# Patient Record
Sex: Male | Born: 1965 | Race: Black or African American | Hispanic: No | Marital: Single | Smoking: Former smoker
Health system: Southern US, Community
[De-identification: ages and names within clinical notes are randomized; demographics above are authoritative.]

---

## 1995-11-06 DIAGNOSIS — S31119A Laceration without foreign body of abdominal wall, unspecified quadrant without penetration into peritoneal cavity, initial encounter: Secondary | ICD-10-CM

## 1995-11-06 DIAGNOSIS — S21112A Laceration without foreign body of left front wall of thorax without penetration into thoracic cavity, initial encounter: Secondary | ICD-10-CM

## 1995-11-06 HISTORY — PX: EXPLORATORY LAPAROTOMY: SUR591

## 1995-11-06 HISTORY — DX: Laceration without foreign body of left front wall of thorax without penetration into thoracic cavity, initial encounter: S21.112A

## 1995-11-06 HISTORY — DX: Laceration without foreign body of abdominal wall, unspecified quadrant without penetration into peritoneal cavity, initial encounter: S31.119A

## 2001-01-11 ENCOUNTER — Emergency Department (HOSPITAL_COMMUNITY): Admission: EM | Admit: 2001-01-11 | Discharge: 2001-01-11 | Payer: Self-pay | Admitting: *Deleted

## 2001-01-11 ENCOUNTER — Encounter: Payer: Self-pay | Admitting: Emergency Medicine

## 2001-03-17 ENCOUNTER — Encounter: Payer: Self-pay | Admitting: Neurology

## 2001-03-17 ENCOUNTER — Ambulatory Visit (HOSPITAL_COMMUNITY): Admission: RE | Admit: 2001-03-17 | Discharge: 2001-03-17 | Payer: Self-pay | Admitting: Neurology

## 2005-11-05 DIAGNOSIS — R2681 Unsteadiness on feet: Secondary | ICD-10-CM

## 2005-11-05 HISTORY — DX: Unsteadiness on feet: R26.81

## 2006-07-08 ENCOUNTER — Emergency Department (HOSPITAL_COMMUNITY): Admission: EM | Admit: 2006-07-08 | Discharge: 2006-07-08 | Payer: Self-pay | Admitting: Emergency Medicine

## 2007-05-08 ENCOUNTER — Emergency Department (HOSPITAL_COMMUNITY): Admission: EM | Admit: 2007-05-08 | Discharge: 2007-05-08 | Payer: Self-pay | Admitting: Emergency Medicine

## 2011-11-06 DIAGNOSIS — S43109A Unspecified dislocation of unspecified acromioclavicular joint, initial encounter: Secondary | ICD-10-CM

## 2011-11-06 DIAGNOSIS — I10 Essential (primary) hypertension: Secondary | ICD-10-CM

## 2011-11-06 HISTORY — DX: Essential (primary) hypertension: I10

## 2011-11-06 HISTORY — DX: Unspecified dislocation of unspecified acromioclavicular joint, initial encounter: S43.109A

## 2020-03-25 ENCOUNTER — Emergency Department (HOSPITAL_COMMUNITY)
Admission: EM | Admit: 2020-03-25 | Discharge: 2020-03-25 | Disposition: A | Discharge status: Home / Self Care | Payer: Self-pay | Attending: Emergency Medicine | Admitting: Emergency Medicine

## 2020-03-25 ENCOUNTER — Encounter (HOSPITAL_COMMUNITY): Payer: Self-pay | Admitting: Emergency Medicine

## 2020-03-25 ENCOUNTER — Other Ambulatory Visit: Payer: Self-pay

## 2020-03-25 DIAGNOSIS — R142 Eructation: Secondary | ICD-10-CM | POA: Insufficient documentation

## 2020-03-25 DIAGNOSIS — R109 Unspecified abdominal pain: Secondary | ICD-10-CM

## 2020-03-25 DIAGNOSIS — R1033 Periumbilical pain: Secondary | ICD-10-CM | POA: Insufficient documentation

## 2020-03-25 DIAGNOSIS — R112 Nausea with vomiting, unspecified: Secondary | ICD-10-CM | POA: Insufficient documentation

## 2020-03-25 LAB — CBC
HCT: 43.6 % (ref 39.0–52.0)
Hemoglobin: 14.8 g/dL (ref 13.0–17.0)
MCH: 27.8 pg (ref 26.0–34.0)
MCHC: 33.9 g/dL (ref 30.0–36.0)
MCV: 81.8 fL (ref 80.0–100.0)
Platelets: 442 10*3/uL — ABNORMAL HIGH (ref 150–400)
RBC: 5.33 MIL/uL (ref 4.22–5.81)
RDW: 13.2 % (ref 11.5–15.5)
WBC: 11.3 10*3/uL — ABNORMAL HIGH (ref 4.0–10.5)
nRBC: 0 % (ref 0.0–0.2)

## 2020-03-25 LAB — URINALYSIS, ROUTINE W REFLEX MICROSCOPIC
Bilirubin Urine: NEGATIVE
Glucose, UA: NEGATIVE mg/dL
Ketones, ur: NEGATIVE mg/dL
Leukocytes,Ua: NEGATIVE
Nitrite: NEGATIVE
Protein, ur: 30 mg/dL — AB
Specific Gravity, Urine: 1.016 (ref 1.005–1.030)
pH: 5 (ref 5.0–8.0)

## 2020-03-25 LAB — COMPREHENSIVE METABOLIC PANEL
ALT: 18 U/L (ref 0–44)
AST: 17 U/L (ref 15–41)
Albumin: 3.9 g/dL (ref 3.5–5.0)
Alkaline Phosphatase: 60 U/L (ref 38–126)
Anion gap: 14 (ref 5–15)
BUN: 49 mg/dL — ABNORMAL HIGH (ref 6–20)
CO2: 21 mmol/L — ABNORMAL LOW (ref 22–32)
Calcium: 8.2 mg/dL — ABNORMAL LOW (ref 8.9–10.3)
Chloride: 95 mmol/L — ABNORMAL LOW (ref 98–111)
Creatinine, Ser: 1.78 mg/dL — ABNORMAL HIGH (ref 0.61–1.24)
GFR calc Af Amer: 50 mL/min — ABNORMAL LOW (ref >60)
GFR calc non Af Amer: 43 mL/min — ABNORMAL LOW (ref >60)
Glucose, Bld: 185 mg/dL — ABNORMAL HIGH (ref 70–99)
Potassium: 2.6 mmol/L — CL (ref 3.5–5.1)
Sodium: 130 mmol/L — ABNORMAL LOW (ref 135–145)
Total Bilirubin: 0.4 mg/dL (ref 0.3–1.2)
Total Protein: 8.2 g/dL — ABNORMAL HIGH (ref 6.5–8.1)

## 2020-03-25 LAB — MAGNESIUM: Magnesium: 2.3 mg/dL (ref 1.7–2.4)

## 2020-03-25 LAB — LIPASE, BLOOD: Lipase: 131 U/L — ABNORMAL HIGH (ref 11–51)

## 2020-03-25 MED ORDER — ONDANSETRON 4 MG PO TBDP
4.0000 mg | ORAL_TABLET | Freq: Three times a day (TID) | ORAL | 0 refills | Status: DC | PRN
Start: 2020-03-25 — End: 2020-04-02

## 2020-03-25 MED ORDER — SODIUM CHLORIDE 0.9 % IV BOLUS
1000.0000 mL | Freq: Once | INTRAVENOUS | Status: AC
  Administered 2020-03-25: 1000 mL via INTRAVENOUS

## 2020-03-25 MED ORDER — FAMOTIDINE IN NACL 20-0.9 MG/50ML-% IV SOLN
20.0000 mg | INTRAVENOUS | Status: AC
  Administered 2020-03-25: 20 mg via INTRAVENOUS
  Filled 2020-03-25: qty 50

## 2020-03-25 MED ORDER — POTASSIUM CHLORIDE CRYS ER 20 MEQ PO TBCR
40.0000 meq | EXTENDED_RELEASE_TABLET | Freq: Once | ORAL | Status: AC
  Administered 2020-03-25: 40 meq via ORAL
  Filled 2020-03-25: qty 2

## 2020-03-25 MED ORDER — OXYCODONE-ACETAMINOPHEN 5-325 MG PO TABS
1.0000 | ORAL_TABLET | Freq: Once | ORAL | Status: AC
  Administered 2020-03-25: 1 via ORAL
  Filled 2020-03-25: qty 1

## 2020-03-25 MED ORDER — SUCRALFATE 1 G PO TABS
1.0000 g | ORAL_TABLET | Freq: Once | ORAL | Status: AC
  Administered 2020-03-25: 1 g via ORAL
  Filled 2020-03-25: qty 1

## 2020-03-25 MED ORDER — FAMOTIDINE 20 MG PO TABS: 20.0000 mg | ORAL_TABLET | Freq: Every day | ORAL | 0 refills | Status: AC

## 2020-03-25 NOTE — ED Notes (Addendum)
Date and time results received: 03/25/20 3:44 AM  (use smartphrase ".now" to insert current time)  Test: potassium Critical Value: 2.6  Name of Provider Notified: Sharilyn Sites, PA  Orders Received? Or Actions Taken?: potassium ordered

## 2020-03-25 NOTE — ED Provider Notes (Signed)
[facility] DEPT Provider Note   CSN: [id number] Arrival date & time: 03/25/20  0244     History Chief Complaint  Patient presents with  . Abdominal Pain    Frank Rivers is a 54 y.o. male.  The history is provided by the patient and medical records.  Abdominal Pain   54 year old male presenting to the ED with mid abdominal pain for the past 3 days.  States he works Monday and Tuesday and felt fine, woke up Wednesday morning with nausea, vomiting, and diarrhea.  He has not had any noted fevers or chills.  States he has had a lot of upper and mid abdominal pain, worse around his navel.  He does report history of stomach ulcers and initially pain felt similar, now having some pain in his back as well.  He does admit to being bent over the toilet and vomiting the past few days. He denies any alcohol intake.  No history of gallbladder disease.  He does report a lot of belching and sensation of acid in his throat.  His sister gave him an acid pill prior to arrival which did help some.  History reviewed. No pertinent past medical history.  There are no problems to display for this patient.   History reviewed. No pertinent surgical history.     History reviewed. No pertinent family history.  Social History   Tobacco Use  . Smoking status: Former Research scientist (life sciences)  . Smokeless tobacco: Never Used  Substance Use Topics  . Alcohol use: Yes    Alcohol/week: 12.0 standard drinks    Types: 12 Cans of beer per week  . Drug use: Yes    Types: Marijuana    Home Medications Prior to Admission medications   Not on File    Allergies    Patient has no allergy information on record.  Review of Systems   Review of Systems  Gastrointestinal: Positive for abdominal pain.  All other systems reviewed and are negative.   Physical Exam Updated Vital Signs BP (!) 154/114   Pulse 93   Temp 97.6 F (36.4 C) (Oral)   Resp 19   Ht 5' 7"  (1.702 m)   Wt 74.8 kg    SpO2 100%   BMI 25.84 kg/m   Physical Exam Vitals and nursing note reviewed.  Constitutional:      Appearance: He is well-developed.     Comments: Belching frequently during exam  HENT:     Head: Normocephalic and atraumatic.     Mouth/Throat:     Comments: Mucous membranes are dry Eyes:     Conjunctiva/sclera: Conjunctivae normal.     Pupils: Pupils are equal, round, and reactive to light.  Cardiovascular:     Rate and Rhythm: Normal rate and regular rhythm.     Heart sounds: Normal heart sounds.  Pulmonary:     Effort: Pulmonary effort is normal.     Breath sounds: Normal breath sounds.  Abdominal:     General: Bowel sounds are normal.     Palpations: Abdomen is soft.     Tenderness: There is no abdominal tenderness. There is no guarding or rebound.     Comments: Well-healed midline surgical incision, there is no focal tenderness or peritoneal signs, bowel sounds are normal, no distention  Musculoskeletal:        General: Normal range of motion.     Cervical back: Normal range of motion.  Skin:    General: Skin is warm and dry.  Neurological:     Mental Status: He is alert and oriented to person, place, and time.     ED Results / Procedures / Treatments   Labs (all labs ordered are listed, but only abnormal results are displayed) Labs Reviewed  LIPASE, BLOOD - Abnormal; Notable for the following components:      Result Value   Lipase 131 (*)    All other components within normal limits  COMPREHENSIVE METABOLIC PANEL - Abnormal; Notable for the following components:   Sodium 130 (*)    Potassium 2.6 (*)    Chloride 95 (*)    CO2 21 (*)    Glucose, Bld 185 (*)    BUN 49 (*)    Creatinine, Ser 1.78 (*)    Calcium 8.2 (*)    Total Protein 8.2 (*)    GFR calc non Af Amer 43 (*)    GFR calc Af Amer 50 (*)    All other components within normal limits  CBC - Abnormal; Notable for the following components:   WBC 11.3 (*)    Platelets 442 (*)    All other  components within normal limits  MAGNESIUM  URINALYSIS, ROUTINE W REFLEX MICROSCOPIC    EKG EKG Interpretation  Date/Time:  Friday Mar 25 2020 03:10:20 EDT Ventricular Rate:  84 PR Interval:    QRS Duration: 88 QT Interval:  342 QTC Calculation: 405 R Axis:   -63 Text Interpretation: Sinus rhythm Biatrial enlargement Left anterior fascicular block Abnormal R-wave progression, early transition Left ventricular hypertrophy No STEMI. No old tracing to compare Confirmed by Addison Lank [phone]) on 03/25/2020 3:13:33 AM   Radiology No results found.  Procedures Procedures (including critical care time)  Medications Ordered in ED Medications  sodium chloride 0.9 % bolus 1,000 mL (0 mLs Intravenous Stopped 03/25/20 0545)  sucralfate (CARAFATE) tablet 1 g (1 g Oral Given 03/25/20 0334)  famotidine (PEPCID) IVPB 20 mg premix (0 mg Intravenous Stopped 03/25/20 0545)  potassium chloride SA (KLOR-CON) CR tablet 40 mEq (40 mEq Oral Given 03/25/20 0353)  oxyCODONE-acetaminophen (PERCOCET/ROXICET) 5-325 MG per tablet 1 tablet (1 tablet Oral Given 03/25/20 0547)    ED Course  I have reviewed the triage vital signs and the nursing notes.  Pertinent labs & imaging results that were available during my care of the patient were reviewed by me and considered in my medical decision making (see chart for details).    MDM Rules/Calculators/A&P  54 year old male here with mid abdominal pain, nausea, vomiting, and diarrhea over the past 3 days.  He is afebrile and nontoxic in appearance here.  His abdomen is soft and benign.  He does appear clinically dry.  Patient does report history of stomach ulcers and states initially symptoms felt similar.  He has had a lot of belching this evening.  His sister gave him an acid reflux pill which seemed to help a little.  Screening labs are pending.  He was given IV fluids, Pepcid, Carafate, and Zofran.  Will reassess.  5:25 AM Labs with findings of likely  dehydration with SrCr of 1.78 and BUN 49.  He does have mild elevation of lipase but normal LFTs, bili, and alk phos.  Potassium was low here at 2.6, given PO replacement.  Likely from vomiting.  No prior labs available for comparison.  Patient reports feeling much better at this time. States his back is still aching, however it is the lower back.  Thinks it is where he has been bent over  vomiting over the past few days. Will give PO percocet. He has tolerated 3 cups of water here without any active emesis or nausea.  Vital signs are stable.  Feel he is stable for discharge home.  Plan to continue symptomatic management, recommended bland diet progression back to normal as tolerated.  Encouraged to push oral fluids as much as possible.  Does not appear to have a PCP at this time so will be referred to wellness clinic for follow-up.  Final Clinical Impression(s) / ED Diagnoses Final diagnoses:  Abdominal pain, unspecified abdominal location  Nausea vomiting and diarrhea    Rx / DC Orders ED Discharge Orders         Ordered    ondansetron (ZOFRAN ODT) 4 MG disintegrating tablet  Every 8 hours PRN     03/25/20 0553    famotidine (PEPCID) 20 MG tablet  Daily     03/25/20 0553           Larene Pickett, PA-C 03/25/20 0557    Fatima Blank, MD 03/25/20 [phone]

## 2020-03-25 NOTE — Discharge Instructions (Signed)
Take the prescribed medication as directed.  Recommend that you follow a bland diet for now and progress back to normal as tolerated.  Try to increase water intake so you can stay hydrated. Follow-up with your primary care doctor.  If you do not have one, you may try to follow-up with the wellness clinic. Return to the ED for new or worsening symptoms.

## 2020-03-25 NOTE — ED Triage Notes (Signed)
Pt arrived via EMS. Pt has been having epigastric pain that moved into the center of his back. Pt reports N/V/D, which started Wednesday morning. Pt reports that he has not been able to eat or drink since Wednesday. Pt has hx of ulcers which have had similar burning feelings, but has never had the pain go into his back. Pt reports no hx of diabetes or hypertension

## 2020-03-26 ENCOUNTER — Other Ambulatory Visit: Payer: Self-pay

## 2020-03-26 ENCOUNTER — Encounter (HOSPITAL_COMMUNITY): Payer: Self-pay

## 2020-03-26 DIAGNOSIS — F10231 Alcohol dependence with withdrawal delirium: Secondary | ICD-10-CM | POA: Diagnosis present

## 2020-03-26 DIAGNOSIS — R079 Chest pain, unspecified: Secondary | ICD-10-CM | POA: Diagnosis present

## 2020-03-26 DIAGNOSIS — E871 Hypo-osmolality and hyponatremia: Secondary | ICD-10-CM | POA: Diagnosis present

## 2020-03-26 DIAGNOSIS — E8809 Other disorders of plasma-protein metabolism, not elsewhere classified: Secondary | ICD-10-CM | POA: Diagnosis present

## 2020-03-26 DIAGNOSIS — M549 Dorsalgia, unspecified: Secondary | ICD-10-CM | POA: Diagnosis present

## 2020-03-26 DIAGNOSIS — Z87891 Personal history of nicotine dependence: Secondary | ICD-10-CM

## 2020-03-26 DIAGNOSIS — I10 Essential (primary) hypertension: Secondary | ICD-10-CM | POA: Diagnosis present

## 2020-03-26 DIAGNOSIS — Z8711 Personal history of peptic ulcer disease: Secondary | ICD-10-CM

## 2020-03-26 DIAGNOSIS — R739 Hyperglycemia, unspecified: Secondary | ICD-10-CM | POA: Diagnosis present

## 2020-03-26 DIAGNOSIS — K219 Gastro-esophageal reflux disease without esophagitis: Secondary | ICD-10-CM | POA: Diagnosis present

## 2020-03-26 DIAGNOSIS — Z20822 Contact with and (suspected) exposure to covid-19: Secondary | ICD-10-CM | POA: Diagnosis present

## 2020-03-26 DIAGNOSIS — E876 Hypokalemia: Secondary | ICD-10-CM | POA: Diagnosis present

## 2020-03-26 DIAGNOSIS — N179 Acute kidney failure, unspecified: Secondary | ICD-10-CM | POA: Diagnosis present

## 2020-03-26 DIAGNOSIS — Z79899 Other long term (current) drug therapy: Secondary | ICD-10-CM

## 2020-03-26 DIAGNOSIS — E861 Hypovolemia: Secondary | ICD-10-CM | POA: Diagnosis present

## 2020-03-26 DIAGNOSIS — K56609 Unspecified intestinal obstruction, unspecified as to partial versus complete obstruction: Principal | ICD-10-CM | POA: Diagnosis present

## 2020-03-26 LAB — CBC
HCT: 46 % (ref 39.0–52.0)
Hemoglobin: 15.5 g/dL (ref 13.0–17.0)
MCH: 27.6 pg (ref 26.0–34.0)
MCHC: 33.7 g/dL (ref 30.0–36.0)
MCV: 82 fL (ref 80.0–100.0)
Platelets: 549 10*3/uL — ABNORMAL HIGH (ref 150–400)
RBC: 5.61 MIL/uL (ref 4.22–5.81)
RDW: 13 % (ref 11.5–15.5)
WBC: 10.3 10*3/uL (ref 4.0–10.5)
nRBC: 0 % (ref 0.0–0.2)

## 2020-03-26 MED ORDER — SODIUM CHLORIDE 0.9% FLUSH: 3.0000 mL | Freq: Once | INTRAVENOUS | Status: AC

## 2020-03-26 NOTE — ED Triage Notes (Addendum)
Pt BIB [facility] for back pain, epigastric, and anterior chest wall pain. Given of fentanyl en route. Seen recently for same and states that symptoms have not improved. Hx ulcers.

## 2020-03-27 ENCOUNTER — Encounter (HOSPITAL_COMMUNITY): Payer: Self-pay

## 2020-03-27 ENCOUNTER — Emergency Department (HOSPITAL_COMMUNITY): Payer: Self-pay

## 2020-03-27 ENCOUNTER — Inpatient Hospital Stay (HOSPITAL_COMMUNITY): Payer: Self-pay

## 2020-03-27 ENCOUNTER — Other Ambulatory Visit: Payer: Self-pay

## 2020-03-27 ENCOUNTER — Inpatient Hospital Stay (HOSPITAL_COMMUNITY)
Admission: EM | Admit: 2020-03-27 | Discharge: 2020-04-02 | DRG: 389 | Disposition: A | Discharge status: Home / Self Care | Payer: Self-pay | Attending: Internal Medicine | Admitting: Internal Medicine

## 2020-03-27 DIAGNOSIS — E871 Hypo-osmolality and hyponatremia: Secondary | ICD-10-CM

## 2020-03-27 DIAGNOSIS — Z8711 Personal history of peptic ulcer disease: Secondary | ICD-10-CM

## 2020-03-27 DIAGNOSIS — Z0189 Encounter for other specified special examinations: Secondary | ICD-10-CM

## 2020-03-27 DIAGNOSIS — K5900 Constipation, unspecified: Secondary | ICD-10-CM

## 2020-03-27 DIAGNOSIS — N179 Acute kidney failure, unspecified: Secondary | ICD-10-CM

## 2020-03-27 DIAGNOSIS — E860 Dehydration: Secondary | ICD-10-CM

## 2020-03-27 DIAGNOSIS — R1013 Epigastric pain: Secondary | ICD-10-CM

## 2020-03-27 DIAGNOSIS — E876 Hypokalemia: Secondary | ICD-10-CM

## 2020-03-27 DIAGNOSIS — R112 Nausea with vomiting, unspecified: Secondary | ICD-10-CM

## 2020-03-27 DIAGNOSIS — I10 Essential (primary) hypertension: Secondary | ICD-10-CM

## 2020-03-27 DIAGNOSIS — R079 Chest pain, unspecified: Secondary | ICD-10-CM

## 2020-03-27 DIAGNOSIS — R739 Hyperglycemia, unspecified: Secondary | ICD-10-CM

## 2020-03-27 DIAGNOSIS — E8809 Other disorders of plasma-protein metabolism, not elsewhere classified: Secondary | ICD-10-CM

## 2020-03-27 DIAGNOSIS — K56609 Unspecified intestinal obstruction, unspecified as to partial versus complete obstruction: Principal | ICD-10-CM

## 2020-03-27 DIAGNOSIS — D72829 Elevated white blood cell count, unspecified: Secondary | ICD-10-CM | POA: Diagnosis present

## 2020-03-27 DIAGNOSIS — F1290 Cannabis use, unspecified, uncomplicated: Secondary | ICD-10-CM

## 2020-03-27 DIAGNOSIS — F1010 Alcohol abuse, uncomplicated: Secondary | ICD-10-CM

## 2020-03-27 DIAGNOSIS — Z4659 Encounter for fitting and adjustment of other gastrointestinal appliance and device: Secondary | ICD-10-CM

## 2020-03-27 HISTORY — DX: Personal history of peptic ulcer disease: Z87.11

## 2020-03-27 LAB — URINALYSIS, ROUTINE W REFLEX MICROSCOPIC
Bacteria, UA: NONE SEEN
Bilirubin Urine: NEGATIVE
Glucose, UA: NEGATIVE mg/dL
Ketones, ur: 5 mg/dL — AB
Leukocytes,Ua: NEGATIVE
Nitrite: NEGATIVE
Protein, ur: NEGATIVE mg/dL
Specific Gravity, Urine: >1.046 — ABNORMAL HIGH (ref 1.005–1.030)
pH: 6 (ref 5.0–8.0)

## 2020-03-27 LAB — BASIC METABOLIC PANEL
Anion gap: 11 (ref 5–15)
BUN: 29 mg/dL — ABNORMAL HIGH (ref 6–20)
CO2: 27 mmol/L (ref 22–32)
Calcium: 9 mg/dL (ref 8.9–10.3)
Chloride: 95 mmol/L — ABNORMAL LOW (ref 98–111)
Creatinine, Ser: 1.29 mg/dL — ABNORMAL HIGH (ref 0.61–1.24)
GFR calc Af Amer: >60 mL/min (ref >60)
GFR calc non Af Amer: >60 mL/min (ref >60)
Glucose, Bld: 102 mg/dL — ABNORMAL HIGH (ref 70–99)
Potassium: 3.5 mmol/L (ref 3.5–5.1)
Sodium: 133 mmol/L — ABNORMAL LOW (ref 135–145)

## 2020-03-27 LAB — CBC
HCT: 40.2 % (ref 39.0–52.0)
Hemoglobin: 13.3 g/dL (ref 13.0–17.0)
MCH: 27.5 pg (ref 26.0–34.0)
MCHC: 33.1 g/dL (ref 30.0–36.0)
MCV: 83.2 fL (ref 80.0–100.0)
Platelets: 482 10*3/uL — ABNORMAL HIGH (ref 150–400)
RBC: 4.83 MIL/uL (ref 4.22–5.81)
RDW: 13.2 % (ref 11.5–15.5)
WBC: 12 10*3/uL — ABNORMAL HIGH (ref 4.0–10.5)
nRBC: 0 % (ref 0.0–0.2)

## 2020-03-27 LAB — COMPREHENSIVE METABOLIC PANEL
ALT: 20 U/L (ref 0–44)
AST: 19 U/L (ref 15–41)
Albumin: 4 g/dL (ref 3.5–5.0)
Alkaline Phosphatase: 68 U/L (ref 38–126)
Anion gap: 9 (ref 5–15)
BUN: 32 mg/dL — ABNORMAL HIGH (ref 6–20)
CO2: 27 mmol/L (ref 22–32)
Calcium: 9.4 mg/dL (ref 8.9–10.3)
Chloride: 92 mmol/L — ABNORMAL LOW (ref 98–111)
Creatinine, Ser: 1.35 mg/dL — ABNORMAL HIGH (ref 0.61–1.24)
GFR calc Af Amer: >60 mL/min (ref >60)
GFR calc non Af Amer: 60 mL/min — ABNORMAL LOW (ref >60)
Glucose, Bld: 159 mg/dL — ABNORMAL HIGH (ref 70–99)
Potassium: 3.8 mmol/L (ref 3.5–5.1)
Sodium: 128 mmol/L — ABNORMAL LOW (ref 135–145)
Total Bilirubin: 1.2 mg/dL (ref 0.3–1.2)
Total Protein: 8.8 g/dL — ABNORMAL HIGH (ref 6.5–8.1)

## 2020-03-27 LAB — HIV ANTIBODY (ROUTINE TESTING W REFLEX): HIV Screen 4th Generation wRfx: NONREACTIVE

## 2020-03-27 LAB — SARS CORONAVIRUS 2 BY RT PCR (HOSPITAL ORDER, PERFORMED IN ~~LOC~~ HOSPITAL LAB): SARS Coronavirus 2: NEGATIVE

## 2020-03-27 LAB — HEMOGLOBIN A1C
Hgb A1c MFr Bld: 6.2 % — ABNORMAL HIGH (ref 4.8–5.6)
Mean Plasma Glucose: 131.24 mg/dL

## 2020-03-27 LAB — MAGNESIUM: Magnesium: 2.2 mg/dL (ref 1.7–2.4)

## 2020-03-27 LAB — PREALBUMIN: Prealbumin: 26.8 mg/dL (ref 18–38)

## 2020-03-27 LAB — PHOSPHORUS: Phosphorus: 3.4 mg/dL (ref 2.5–4.6)

## 2020-03-27 LAB — LIPASE, BLOOD: Lipase: 147 U/L — ABNORMAL HIGH (ref 11–51)

## 2020-03-27 MED ORDER — MAGIC MOUTHWASH
15.0000 mL | Freq: Four times a day (QID) | ORAL | Status: DC | PRN
  Filled 2020-03-27: qty 15

## 2020-03-27 MED ORDER — HEPARIN SODIUM (PORCINE) 5000 UNIT/ML IJ SOLN
5000.0000 [IU] | Freq: Three times a day (TID) | INTRAMUSCULAR | Status: DC
  Administered 2020-03-27 – 2020-04-02 (×17): 5000 [IU] via SUBCUTANEOUS
  Filled 2020-03-27 (×17): qty 1

## 2020-03-27 MED ORDER — FAMOTIDINE IN NACL 20-0.9 MG/50ML-% IV SOLN
20.0000 mg | Freq: Two times a day (BID) | INTRAVENOUS | Status: DC
  Administered 2020-03-27 – 2020-04-01 (×12): 20 mg via INTRAVENOUS
  Filled 2020-03-27 (×12): qty 50

## 2020-03-27 MED ORDER — PROCHLORPERAZINE EDISYLATE 10 MG/2ML IJ SOLN
5.0000 mg | INTRAMUSCULAR | Status: DC | PRN
  Administered 2020-03-27: 10 mg via INTRAVENOUS
  Filled 2020-03-27: qty 2

## 2020-03-27 MED ORDER — DIATRIZOATE MEGLUMINE & SODIUM 66-10 % PO SOLN
90.0000 mL | Freq: Once | ORAL | Status: AC
  Administered 2020-03-27: 90 mL via NASOGASTRIC
  Filled 2020-03-27: qty 90

## 2020-03-27 MED ORDER — SODIUM CHLORIDE (PF) 0.9 % IJ SOLN
INTRAMUSCULAR | Status: AC
  Administered 2020-03-27: 3 mL via INTRAVENOUS
  Filled 2020-03-27: qty 50

## 2020-03-27 MED ORDER — LACTATED RINGERS IV BOLUS
1000.0000 mL | Freq: Once | INTRAVENOUS | Status: AC
  Administered 2020-03-27: 1000 mL via INTRAVENOUS

## 2020-03-27 MED ORDER — LORAZEPAM 1 MG PO TABS
0.0000 mg | ORAL_TABLET | Freq: Four times a day (QID) | ORAL | Status: AC
  Filled 2020-03-27: qty 1
  Filled 2020-03-27: qty 2

## 2020-03-27 MED ORDER — ALUM & MAG HYDROXIDE-SIMETH 200-200-20 MG/5ML PO SUSP: 30.0000 mL | Freq: Four times a day (QID) | ORAL | Status: DC | PRN

## 2020-03-27 MED ORDER — DIPHENHYDRAMINE HCL 50 MG/ML IJ SOLN: 12.5000 mg | Freq: Four times a day (QID) | INTRAMUSCULAR | Status: DC | PRN

## 2020-03-27 MED ORDER — IOHEXOL 300 MG/ML  SOLN
100.0000 mL | Freq: Once | INTRAMUSCULAR | Status: AC | PRN
  Administered 2020-03-27: 100 mL via INTRAVENOUS

## 2020-03-27 MED ORDER — PANTOPRAZOLE SODIUM 40 MG IV SOLR
40.0000 mg | Freq: Every day | INTRAVENOUS | Status: DC
  Administered 2020-03-28 – 2020-04-01 (×5): 40 mg via INTRAVENOUS
  Filled 2020-03-27 (×7): qty 40

## 2020-03-27 MED ORDER — MENTHOL 3 MG MT LOZG
1.0000 | LOZENGE | OROMUCOSAL | Status: DC | PRN
  Filled 2020-03-27: qty 9

## 2020-03-27 MED ORDER — PHENOL 1.4 % MT LIQD
2.0000 | OROMUCOSAL | Status: DC | PRN
  Filled 2020-03-27: qty 177

## 2020-03-27 MED ORDER — MORPHINE SULFATE (PF) 4 MG/ML IV SOLN
4.0000 mg | Freq: Once | INTRAVENOUS | Status: AC
  Administered 2020-03-27: 4 mg via INTRAVENOUS
  Filled 2020-03-27: qty 1

## 2020-03-27 MED ORDER — SODIUM CHLORIDE 0.9 % IV BOLUS
1000.0000 mL | Freq: Once | INTRAVENOUS | Status: AC
  Administered 2020-03-27: 1000 mL via INTRAVENOUS

## 2020-03-27 MED ORDER — MORPHINE SULFATE (PF) 2 MG/ML IV SOLN
2.0000 mg | INTRAVENOUS | Status: DC | PRN
Start: 2020-03-27 — End: 2020-03-27

## 2020-03-27 MED ORDER — SODIUM CHLORIDE 0.9 % IV SOLN
8.0000 mg | Freq: Four times a day (QID) | INTRAVENOUS | Status: DC | PRN
  Filled 2020-03-27: qty 4

## 2020-03-27 MED ORDER — LACTATED RINGERS IV SOLN: INTRAVENOUS | Status: AC

## 2020-03-27 MED ORDER — ENALAPRILAT 1.25 MG/ML IV SOLN
0.6250 mg | Freq: Four times a day (QID) | INTRAVENOUS | Status: DC | PRN
  Filled 2020-03-27: qty 1

## 2020-03-27 MED ORDER — ONDANSETRON HCL 4 MG/2ML IJ SOLN
4.0000 mg | Freq: Once | INTRAMUSCULAR | Status: AC
  Administered 2020-03-27: 4 mg via INTRAVENOUS
  Filled 2020-03-27: qty 2

## 2020-03-27 MED ORDER — METOPROLOL TARTRATE 5 MG/5ML IV SOLN
5.0000 mg | Freq: Four times a day (QID) | INTRAVENOUS | Status: DC
  Administered 2020-03-27 – 2020-04-02 (×25): 5 mg via INTRAVENOUS
  Filled 2020-03-27 (×25): qty 5

## 2020-03-27 MED ORDER — LORAZEPAM 1 MG PO TABS: 0.0000 mg | ORAL_TABLET | Freq: Two times a day (BID) | ORAL | Status: AC

## 2020-03-27 MED ORDER — LORAZEPAM 1 MG PO TABS
1.0000 mg | ORAL_TABLET | ORAL | Status: AC | PRN
  Administered 2020-03-29: 2 mg via ORAL

## 2020-03-27 MED ORDER — HYDROMORPHONE HCL 1 MG/ML IJ SOLN
0.5000 mg | INTRAMUSCULAR | Status: DC | PRN
  Administered 2020-03-27 (×3): 2 mg via INTRAVENOUS
  Administered 2020-03-28 (×2): 1 mg via INTRAVENOUS
  Filled 2020-03-27 (×2): qty 2
  Filled 2020-03-27: qty 1
  Filled 2020-03-27: qty 2
  Filled 2020-03-27: qty 1

## 2020-03-27 MED ORDER — LIP MEDEX EX OINT
1.0000 "application " | TOPICAL_OINTMENT | Freq: Two times a day (BID) | CUTANEOUS | Status: DC
  Administered 2020-03-27 – 2020-04-02 (×12): 1 via TOPICAL
  Filled 2020-03-27 (×4): qty 7

## 2020-03-27 MED ORDER — METHOCARBAMOL 1000 MG/10ML IJ SOLN
1000.0000 mg | Freq: Four times a day (QID) | INTRAVENOUS | Status: DC | PRN
  Filled 2020-03-27: qty 10

## 2020-03-27 MED ORDER — LORAZEPAM 2 MG/ML IJ SOLN
1.0000 mg | INTRAMUSCULAR | Status: AC | PRN
  Administered 2020-03-28 (×2): 2 mg via INTRAVENOUS
  Filled 2020-03-27 (×3): qty 1

## 2020-03-27 MED ORDER — ONDANSETRON HCL 4 MG/2ML IJ SOLN
4.0000 mg | Freq: Four times a day (QID) | INTRAMUSCULAR | Status: DC | PRN
  Administered 2020-03-27 – 2020-03-31 (×4): 4 mg via INTRAVENOUS
  Filled 2020-03-27 (×4): qty 2

## 2020-03-27 MED ORDER — SIMETHICONE 40 MG/0.6ML PO SUSP
40.0000 mg | Freq: Four times a day (QID) | ORAL | Status: DC | PRN
  Filled 2020-03-27: qty 0.6

## 2020-03-27 MED ORDER — LACTATED RINGERS IV BOLUS: 1000.0000 mL | Freq: Three times a day (TID) | INTRAVENOUS | Status: AC | PRN

## 2020-03-27 MED ORDER — ACETAMINOPHEN 650 MG RE SUPP: 650.0000 mg | Freq: Four times a day (QID) | RECTAL | Status: DC | PRN

## 2020-03-27 MED ORDER — BISACODYL 10 MG RE SUPP: 10.0000 mg | Freq: Two times a day (BID) | RECTAL | Status: DC | PRN
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
## 2020-03-27 NOTE — Progress Notes (Signed)
Patient on floor.  Less abdominal pain since nasogastric tube placed.  Surgery will continue to follow.  Continue small bowel protocol.
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
## 2020-03-27 NOTE — ED Provider Notes (Signed)
[facility] DEPT Provider Note   CSN: [id number] Arrival date & time: 03/26/20  2324     History Chief Complaint  Patient presents with  . Abdominal Pain  . Back Pain    Frank Rivers is a 54 y.o. male.  Patient presents to the emergency department with a chief complaint of abdominal pain.  He reports having epigastric abdominal pain for the past several days.  He reports associated nausea and vomiting.  He states that the pain radiates into his back.  He states that he does drink alcohol regularly, but has not been able to drink because of vomiting.  He denies any fevers or chills.  Denies any history of pancreatitis.  Denies any known medical problems.  He states that he does not take any medications on a daily basis.  His symptoms are worsened with eating and with palpation.  The history is provided by the patient. No language interpreter was used.       History reviewed. No pertinent past medical history.  There are no problems to display for this patient.   History reviewed. No pertinent surgical history.     History reviewed. No pertinent family history.  Social History   Tobacco Use  . Smoking status: Former Research scientist (life sciences)  . Smokeless tobacco: Never Used  Substance Use Topics  . Alcohol use: Yes    Alcohol/week: 12.0 standard drinks    Types: 12 Cans of beer per week  . Drug use: Yes    Types: Marijuana    Home Medications Prior to Admission medications   Medication Sig Start Date End Date Taking? Authorizing Provider  famotidine (PEPCID) 20 MG tablet Take 1 tablet (20 mg total) by mouth daily. 03/25/20  Yes Larene Pickett, PA-C  ondansetron (ZOFRAN ODT) 4 MG disintegrating tablet Take 1 tablet (4 mg total) by mouth every 8 (eight) hours as needed for nausea. 03/25/20  Yes Larene Pickett, PA-C    Allergies    Patient has no known allergies.  Review of Systems   Review of Systems  All other systems reviewed and are  negative.   Physical Exam Updated Vital Signs BP (!) 149/106   Pulse 96   Temp 98.5 F (36.9 C) (Oral)   Resp 18   Ht 5\' 7"  (1.702 m)   Wt 74.8 kg   SpO2 98%   BMI 25.84 kg/m   Physical Exam Vitals and nursing note reviewed.  Constitutional:      Appearance: He is well-developed.  HENT:     Head: Normocephalic and atraumatic.  Eyes:     Conjunctiva/sclera: Conjunctivae normal.  Cardiovascular:     Rate and Rhythm: Normal rate and regular rhythm.     Heart sounds: No murmur.  Pulmonary:     Effort: Pulmonary effort is normal. No respiratory distress.     Breath sounds: Normal breath sounds.  Abdominal:     Palpations: Abdomen is soft.     Tenderness: There is abdominal tenderness.     Comments: Epigastric abdominal tenderness, no Murphy sign  Musculoskeletal:     Cervical back: Neck supple.  Skin:    General: Skin is warm and dry.  Neurological:     Mental Status: He is alert and oriented to person, place, and time.  Psychiatric:        Mood and Affect: Mood normal.        Behavior: Behavior normal.     ED Results / Procedures / Treatments  Labs (all labs ordered are listed, but only abnormal results are displayed) Labs Reviewed  LIPASE, BLOOD - Abnormal; Notable for the following components:      Result Value   Lipase 147 (*)    All other components within normal limits  COMPREHENSIVE METABOLIC PANEL - Abnormal; Notable for the following components:   Sodium 128 (*)    Chloride 92 (*)    Glucose, Bld 159 (*)    BUN 32 (*)    Creatinine, Ser 1.35 (*)    Total Protein 8.8 (*)    GFR calc non Af Amer 60 (*)    All other components within normal limits  CBC - Abnormal; Notable for the following components:   Platelets 549 (*)    All other components within normal limits  SARS CORONAVIRUS 2 BY RT PCR (HOSPITAL ORDER, PERFORMED IN [facility])  URINALYSIS, ROUTINE W REFLEX MICROSCOPIC    EKG None  Radiology CT ABDOMEN PELVIS W  CONTRAST  Result Date: 03/27/2020 CLINICAL DATA:  Pancreatitis suspected, back and epigastric pain EXAM: CT ABDOMEN AND PELVIS WITH CONTRAST TECHNIQUE: Multidetector CT imaging of the abdomen and pelvis was performed using the standard protocol following bolus administration of intravenous contrast. CONTRAST:  OMNIPAQUE IOHEXOL 300 MG/ML  SOLN COMPARISON:  None FINDINGS: Lower chest: Lung bases are clear. Normal heart size. No pericardial effusion. Hepatobiliary: No focal liver abnormality is seen. No gallstones, gallbladder wall thickening, or biliary dilatation. Pancreas: Unremarkable. No pancreatic ductal dilatation or surrounding inflammatory changes. Spleen: Normal in size without focal abnormality. Adrenals/Urinary Tract: Normal adrenals. Kidneys are normally located with symmetric enhancement and excretion. No suspicious renal lesion, urolithiasis or hydronephrosis. Urinary bladder is unremarkable. Stomach/Bowel: Distal esophagus, stomach and duodenum are unremarkable. There is significant air and fluid distention numerous loops of small bowel throughout the abdomen. Which began in the proximal jejunum (2/38) with a pair of focal transition points in the left lower quadrant (2/50) resulting in a separate closed loop obstruction. No pneumatosis or portal venous gas. No abnormal bowel wall enhancement or thickening. More distal decompression after the closed loop of bowel. Much of the colon is fluid-filled with a lack of formed stool which suggests distal decompression. Few noninflamed colonic diverticula. A normal appendix is visualized. Vascular/Lymphatic: Atherosclerotic calcifications throughout the abdominal aorta and branch vessels. No aneurysm or ectasia. No enlarged abdominopelvic lymph nodes. Reproductive: The prostate and seminal vesicles are unremarkable. Question a loculated epididymal cyst versus hydrocele in the right scrotum. Other: Trace abdominopelvic free fluid in the pelvis. Few  punctate calcifications in the left lower quadrant may reflect calcified peritoneal loose bodies or calcified lymph nodes. No abdominopelvic free air. No bowel containing hernias. Musculoskeletal: Mild degenerative changes in the spine. No acute osseous abnormality or suspicious osseous lesion. IMPRESSION: 1. High-grade small bowel obstruction with a short segment closed loop obstruction and more diffuse upstream dilatation beginning from the proximal jejunum. Paired transition points are seen left lower quadrant (2/50, 5/46). No convincing evidence of bowel ischemia or perforation at this time. Small volume of fluid in the pelvis is favored to be reactive free fluid. 2. Normal appearance of the pancreas, no evidence of pancreatitis. 3. Question a loculated epididymal cyst versus hydrocele in the right scrotum. Consider correlation with physical exam in outpatient scrotal ultrasound. 4. Aortic Atherosclerosis (ICD10-I70.0). Critical Value/emergent results were called by telephone at the time of interpretation on 03/27/2020 at 3:46 am to provider Roxy Horseman , who verbally acknowledged these results. Electronically Signed  By: Kreg Shropshire M.D.   On: 03/27/2020 03:46    Procedures Procedures (including critical care time)  Medications Ordered in ED Medications  sodium chloride flush (NS) 0.9 % injection 3 mL (has no administration in time range)  sodium chloride 0.9 % bolus 1,000 mL (has no administration in time range)  morphine 4 MG/ML injection 4 mg (has no administration in time range)  ondansetron (ZOFRAN) injection 4 mg (has no administration in time range)  iohexol (OMNIPAQUE) 300 MG/ML solution 100 mL (has no administration in time range)    ED Course  I have reviewed the triage vital signs and the nursing notes.  Pertinent labs & imaging results that were available during my care of the patient were reviewed by me and considered in my medical decision making (see chart for details).     MDM Rules/Calculators/A&P                      This patient complains of abdominal pain and vomiting x 3 days, this involves an extensive number of treatment options, and is a complaint that carries with it a high risk of complications and morbidity.  The differential diagnosis includes pancreatitis, viral illness, SBO.  Pertinent Labs I ordered, reviewed, and interpreted labs, which included CBC with no leukocytosis, CMP notable for probable mild dehydration NA 128, CL 92, Cr 1.35, Lipase 147, covid negative.  Imaging Interpretation I ordered imaging studies which included CT abd/pel, which shows evidence of high grade bowel obstruction.  Medications I ordered medication morphine and zofran for pain and nausea.  Sources Previous records obtained and reviewed recent visit for nausea and vomiting.  Consultants Dr. Michaell Cowing- General Surgery.  Recommends admit to medicine.  Small bowel protocol.  NG tube.  Will consult.  Dr. Alvester Morin- [facility], who will admit.  Critical Interventions  None  Reassessments After the interventions stated above, I reevaluated the patient and found improved. Pain better controlled.  Not vomiting.   Final Clinical Impression(s) / ED Diagnoses Final diagnoses:  Small bowel obstruction [facility])  Dehydration    Rx / DC Orders ED Discharge Orders    None       Roxy Horseman, PA-C 03/27/20 0455    Ward, Layla Maw, DO 03/27/20 [phone]
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
## 2020-03-27 NOTE — ED Notes (Signed)
Patient states that he did not refuse surgery. Need MD to talk to patient before signing AMA paperwork.
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
## 2020-03-27 NOTE — ED Notes (Signed)
Patient states that his upper and back is in pain. Patient denies any injury. There is no bruises or swelling in these areas.

## 2020-03-28 ENCOUNTER — Inpatient Hospital Stay (HOSPITAL_COMMUNITY): Payer: Self-pay

## 2020-03-28 LAB — CBC
HCT: 41.2 % (ref 39.0–52.0)
Hemoglobin: 13.3 g/dL (ref 13.0–17.0)
MCH: 27.2 pg (ref 26.0–34.0)
MCHC: 32.3 g/dL (ref 30.0–36.0)
MCV: 84.3 fL (ref 80.0–100.0)
Platelets: 463 10*3/uL — ABNORMAL HIGH (ref 150–400)
RBC: 4.89 MIL/uL (ref 4.22–5.81)
RDW: 13.5 % (ref 11.5–15.5)
WBC: 17.6 10*3/uL — ABNORMAL HIGH (ref 4.0–10.5)
nRBC: 0 % (ref 0.0–0.2)

## 2020-03-28 LAB — BASIC METABOLIC PANEL
Anion gap: 13 (ref 5–15)
BUN: 24 mg/dL — ABNORMAL HIGH (ref 6–20)
CO2: 26 mmol/L (ref 22–32)
Calcium: 8.8 mg/dL — ABNORMAL LOW (ref 8.9–10.3)
Chloride: 97 mmol/L — ABNORMAL LOW (ref 98–111)
Creatinine, Ser: 1.1 mg/dL (ref 0.61–1.24)
GFR calc Af Amer: >60 mL/min (ref >60)
GFR calc non Af Amer: >60 mL/min (ref >60)
Glucose, Bld: 99 mg/dL (ref 70–99)
Potassium: 3.7 mmol/L (ref 3.5–5.1)
Sodium: 136 mmol/L (ref 135–145)

## 2020-03-28 MED ORDER — KCL-LACTATED RINGERS-D5W 20 MEQ/L IV SOLN
INTRAVENOUS | Status: DC
  Filled 2020-03-28 (×15): qty 1000
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
## 2020-03-28 NOTE — Progress Notes (Signed)
Patient restless and confused. Pulled out NGT. Patient attempted to exit bed multiple times. Oriented patient on environment and situation. Educated not to get out of bed without assistance. Walked patient in room. Patient now resting in bed with call light within reach. Bed in low position with exit alarm on. Re-inserted NGT as ordered. Verifying placement with chest x-ray.  Will continue to monitor.

## 2020-03-29 ENCOUNTER — Inpatient Hospital Stay (HOSPITAL_COMMUNITY): Payer: Self-pay

## 2020-03-29 LAB — CBC
HCT: 39.9 % (ref 39.0–52.0)
Hemoglobin: 12.7 g/dL — ABNORMAL LOW (ref 13.0–17.0)
MCH: 26.7 pg (ref 26.0–34.0)
MCHC: 31.8 g/dL (ref 30.0–36.0)
MCV: 83.8 fL (ref 80.0–100.0)
Platelets: 377 10*3/uL (ref 150–400)
RBC: 4.76 MIL/uL (ref 4.22–5.81)
RDW: 13.3 % (ref 11.5–15.5)
WBC: 11.5 10*3/uL — ABNORMAL HIGH (ref 4.0–10.5)
nRBC: 0 % (ref 0.0–0.2)

## 2020-03-29 LAB — BASIC METABOLIC PANEL
Anion gap: 8 (ref 5–15)
BUN: 17 mg/dL (ref 6–20)
CO2: 28 mmol/L (ref 22–32)
Calcium: 9.1 mg/dL (ref 8.9–10.3)
Chloride: 103 mmol/L (ref 98–111)
Creatinine, Ser: 0.97 mg/dL (ref 0.61–1.24)
GFR calc Af Amer: >60 mL/min (ref >60)
GFR calc non Af Amer: >60 mL/min (ref >60)
Glucose, Bld: 127 mg/dL — ABNORMAL HIGH (ref 70–99)
Potassium: 3.9 mmol/L (ref 3.5–5.1)
Sodium: 139 mmol/L (ref 135–145)

## 2020-03-29 LAB — RAPID URINE DRUG SCREEN, HOSP PERFORMED
Amphetamines: NOT DETECTED
Barbiturates: NOT DETECTED
Benzodiazepines: POSITIVE — AB
Cocaine: NOT DETECTED
Opiates: POSITIVE — AB
Tetrahydrocannabinol: POSITIVE — AB

## 2020-03-29 MED ORDER — HYDROMORPHONE HCL 1 MG/ML IJ SOLN
0.5000 mg | INTRAMUSCULAR | Status: DC | PRN
  Administered 2020-03-29 – 2020-04-01 (×11): 0.5 mg via INTRAVENOUS
  Filled 2020-03-29 (×11): qty 0.5
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
## 2020-03-30 ENCOUNTER — Inpatient Hospital Stay (HOSPITAL_COMMUNITY): Payer: Self-pay

## 2020-03-30 DIAGNOSIS — D72829 Elevated white blood cell count, unspecified: Secondary | ICD-10-CM | POA: Diagnosis present

## 2020-03-30 DIAGNOSIS — I10 Essential (primary) hypertension: Secondary | ICD-10-CM

## 2020-03-30 DIAGNOSIS — F1010 Alcohol abuse, uncomplicated: Secondary | ICD-10-CM

## 2020-03-30 LAB — BASIC METABOLIC PANEL
Anion gap: 6 (ref 5–15)
BUN: 13 mg/dL (ref 6–20)
CO2: 29 mmol/L (ref 22–32)
Calcium: 8.5 mg/dL — ABNORMAL LOW (ref 8.9–10.3)
Chloride: 104 mmol/L (ref 98–111)
Creatinine, Ser: 1.04 mg/dL (ref 0.61–1.24)
GFR calc Af Amer: >60 mL/min (ref >60)
GFR calc non Af Amer: >60 mL/min (ref >60)
Glucose, Bld: 124 mg/dL — ABNORMAL HIGH (ref 70–99)
Potassium: 4 mmol/L (ref 3.5–5.1)
Sodium: 139 mmol/L (ref 135–145)

## 2020-03-30 LAB — PROTEIN ELECTROPHORESIS, SERUM
A/G Ratio: 0.8 (ref 0.7–1.7)
Albumin ELP: 3.4 g/dL (ref 2.9–4.4)
Alpha-1-Globulin: 0.3 g/dL (ref 0.0–0.4)
Alpha-2-Globulin: 0.9 g/dL (ref 0.4–1.0)
Beta Globulin: 1.3 g/dL (ref 0.7–1.3)
Gamma Globulin: 2 g/dL — ABNORMAL HIGH (ref 0.4–1.8)
Globulin, Total: 4.4 g/dL — ABNORMAL HIGH (ref 2.2–3.9)
Total Protein ELP: 7.8 g/dL (ref 6.0–8.5)

## 2020-03-30 LAB — CBC
HCT: 35.9 % — ABNORMAL LOW (ref 39.0–52.0)
Hemoglobin: 11.5 g/dL — ABNORMAL LOW (ref 13.0–17.0)
MCH: 27.4 pg (ref 26.0–34.0)
MCHC: 32 g/dL (ref 30.0–36.0)
MCV: 85.7 fL (ref 80.0–100.0)
Platelets: 342 10*3/uL (ref 150–400)
RBC: 4.19 MIL/uL — ABNORMAL LOW (ref 4.22–5.81)
RDW: 13.6 % (ref 11.5–15.5)
WBC: 14.4 10*3/uL — ABNORMAL HIGH (ref 4.0–10.5)
nRBC: 0 % (ref 0.0–0.2)

## 2020-03-30 LAB — MAGNESIUM: Magnesium: 1.5 mg/dL — ABNORMAL LOW (ref 1.7–2.4)

## 2020-03-30 LAB — PHOSPHORUS: Phosphorus: 3.4 mg/dL (ref 2.5–4.6)

## 2020-03-30 MED ORDER — MAGNESIUM SULFATE 2 GM/50ML IV SOLN
2.0000 g | Freq: Once | INTRAVENOUS | Status: AC
  Administered 2020-03-30: 2 g via INTRAVENOUS
  Filled 2020-03-30: qty 50

## 2020-03-30 MED ORDER — GLYCERIN (LAXATIVE) 2.1 G RE SUPP
1.0000 | Freq: Once | RECTAL | Status: AC
  Administered 2020-03-30: 1 via RECTAL
  Filled 2020-03-30: qty 1
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
## 2020-03-31 LAB — BASIC METABOLIC PANEL
Anion gap: 12 (ref 5–15)
BUN: 9 mg/dL (ref 6–20)
CO2: 24 mmol/L (ref 22–32)
Calcium: 8.8 mg/dL — ABNORMAL LOW (ref 8.9–10.3)
Chloride: 98 mmol/L (ref 98–111)
Creatinine, Ser: 0.92 mg/dL (ref 0.61–1.24)
GFR calc Af Amer: >60 mL/min (ref >60)
GFR calc non Af Amer: >60 mL/min (ref >60)
Glucose, Bld: 105 mg/dL — ABNORMAL HIGH (ref 70–99)
Potassium: 4.3 mmol/L (ref 3.5–5.1)
Sodium: 134 mmol/L — ABNORMAL LOW (ref 135–145)

## 2020-03-31 LAB — CBC
HCT: 39.2 % (ref 39.0–52.0)
Hemoglobin: 12.4 g/dL — ABNORMAL LOW (ref 13.0–17.0)
MCH: 27.2 pg (ref 26.0–34.0)
MCHC: 31.6 g/dL (ref 30.0–36.0)
MCV: 86 fL (ref 80.0–100.0)
Platelets: 347 10*3/uL (ref 150–400)
RBC: 4.56 MIL/uL (ref 4.22–5.81)
RDW: 13.6 % (ref 11.5–15.5)
WBC: 10 10*3/uL (ref 4.0–10.5)
nRBC: 0 % (ref 0.0–0.2)

## 2020-03-31 LAB — UPEP/UIFE/LIGHT CHAINS/TP, 24-HR UR
% BETA, Urine: 24 %
ALPHA 1 URINE: 4.9 %
Albumin, U: 18.2 %
Alpha 2, Urine: 20.7 %
Free Kappa Lt Chains,Ur: 440.24 mg/L — ABNORMAL HIGH (ref 0.63–113.79)
Free Kappa/Lambda Ratio: 6.32 (ref 1.03–31.76)
Free Lambda Lt Chains,Ur: 69.65 mg/L — ABNORMAL HIGH (ref 0.47–11.77)
GAMMA GLOBULIN URINE: 32.2 %
Total Protein, Urine-Ur/day: 237 mg/24 hr — ABNORMAL HIGH (ref 30–150)
Total Protein, Urine: 43.1 mg/dL
Total Volume: 550

## 2020-03-31 LAB — MAGNESIUM: Magnesium: 1.8 mg/dL (ref 1.7–2.4)

## 2020-03-31 MED ORDER — MAGNESIUM SULFATE 2 GM/50ML IV SOLN
2.0000 g | Freq: Once | INTRAVENOUS | Status: AC
  Administered 2020-03-31: 2 g via INTRAVENOUS
  Filled 2020-03-31: qty 50

## 2020-03-31 MED ORDER — GLYCERIN (LAXATIVE) 2.1 G RE SUPP
1.0000 | Freq: Once | RECTAL | Status: AC
  Administered 2020-03-31: 1 via RECTAL
  Filled 2020-03-31: qty 1
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
## 2020-04-01 ENCOUNTER — Inpatient Hospital Stay (HOSPITAL_COMMUNITY): Payer: Self-pay

## 2020-04-01 DIAGNOSIS — D72829 Elevated white blood cell count, unspecified: Secondary | ICD-10-CM

## 2020-04-01 DIAGNOSIS — Z8719 Personal history of other diseases of the digestive system: Secondary | ICD-10-CM

## 2020-04-01 LAB — BASIC METABOLIC PANEL
Anion gap: 7 (ref 5–15)
BUN: 9 mg/dL (ref 6–20)
CO2: 24 mmol/L (ref 22–32)
Calcium: 8.4 mg/dL — ABNORMAL LOW (ref 8.9–10.3)
Chloride: 102 mmol/L (ref 98–111)
Creatinine, Ser: 0.95 mg/dL (ref 0.61–1.24)
GFR calc Af Amer: >60 mL/min (ref >60)
GFR calc non Af Amer: >60 mL/min (ref >60)
Glucose, Bld: 98 mg/dL (ref 70–99)
Potassium: 4.5 mmol/L (ref 3.5–5.1)
Sodium: 133 mmol/L — ABNORMAL LOW (ref 135–145)

## 2020-04-01 LAB — CBC
HCT: 36.3 % — ABNORMAL LOW (ref 39.0–52.0)
Hemoglobin: 11.3 g/dL — ABNORMAL LOW (ref 13.0–17.0)
MCH: 27 pg (ref 26.0–34.0)
MCHC: 31.1 g/dL (ref 30.0–36.0)
MCV: 86.6 fL (ref 80.0–100.0)
Platelets: 330 10*3/uL (ref 150–400)
RBC: 4.19 MIL/uL — ABNORMAL LOW (ref 4.22–5.81)
RDW: 13.7 % (ref 11.5–15.5)
WBC: 18.2 10*3/uL — ABNORMAL HIGH (ref 4.0–10.5)
nRBC: 0 % (ref 0.0–0.2)

## 2020-04-01 LAB — MAGNESIUM: Magnesium: 1.9 mg/dL (ref 1.7–2.4)
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
## 2020-04-02 LAB — BASIC METABOLIC PANEL
Anion gap: 6 (ref 5–15)
BUN: 5 mg/dL — ABNORMAL LOW (ref 6–20)
CO2: 22 mmol/L (ref 22–32)
Calcium: 8.3 mg/dL — ABNORMAL LOW (ref 8.9–10.3)
Chloride: 103 mmol/L (ref 98–111)
Creatinine, Ser: 0.8 mg/dL (ref 0.61–1.24)
GFR calc Af Amer: >60 mL/min (ref >60)
GFR calc non Af Amer: >60 mL/min (ref >60)
Glucose, Bld: 104 mg/dL — ABNORMAL HIGH (ref 70–99)
Potassium: 4.5 mmol/L (ref 3.5–5.1)
Sodium: 131 mmol/L — ABNORMAL LOW (ref 135–145)

## 2020-04-02 LAB — CBC
HCT: 33.8 % — ABNORMAL LOW (ref 39.0–52.0)
Hemoglobin: 10.7 g/dL — ABNORMAL LOW (ref 13.0–17.0)
MCH: 27.2 pg (ref 26.0–34.0)
MCHC: 31.7 g/dL (ref 30.0–36.0)
MCV: 86 fL (ref 80.0–100.0)
Platelets: 308 10*3/uL (ref 150–400)
RBC: 3.93 MIL/uL — ABNORMAL LOW (ref 4.22–5.81)
RDW: 13.5 % (ref 11.5–15.5)
WBC: 7.5 10*3/uL (ref 4.0–10.5)
nRBC: 0 % (ref 0.0–0.2)

## 2020-04-02 MED ORDER — ACETAMINOPHEN 325 MG PO TABS
650.0000 mg | ORAL_TABLET | ORAL | Status: DC | PRN
  Administered 2020-04-02: 650 mg via ORAL
  Filled 2020-04-02: qty 2

## 2020-04-02 MED ORDER — ACETAMINOPHEN 325 MG PO TABS: 650.0000 mg | ORAL_TABLET | ORAL | Status: AC | PRN

## 2020-04-02 MED ORDER — ONDANSETRON HCL 4 MG PO TABS: 4.0000 mg | ORAL_TABLET | Freq: Every day | ORAL | 1 refills | Status: AC | PRN

## 2020-04-02 NOTE — Final Consult Note (Signed)
Consultant Final Sign-Off Note    Assessment/Final recommendations  Frank Rivers is a 54 y.o. male followed by [facility] for SBO.    Wound care (if applicable):    Diet at discharge: per primary team   Activity at discharge: per primary team   Follow-up appointment:  PRN   Pending results:  Unresulted Labs (From admission, onward)   None       Medication recommendations:   Other recommendations: diet advanced.  Ok for d/c from our standpoint    Thank you for allowing [place] to participate in the care of your patient!  Please consult [place] again if you have further needs for your patient.  Vanita Panda 04/02/2020 7:40 AM    Subjective    Having bowel function.  Tolerating fulls.   Objective  Vital signs in last 24 hours: Temp:  [97.1 F (36.2 C)-99 F (37.2 C)] 97.1 F (36.2 C) (05/29 0600) Pulse Rate:  [81-89] 89 (05/29 0600) Resp:  [16-17] 17 (05/29 0600) BP: (108-135)/(67-95) 119/67 (05/29 0600) SpO2:  [100 %] 100 % (05/29 0600)  General: Abd: soft, nondistended   Pertinent labs and Studies: Recent Labs    03/31/20 0451 04/01/20 0450 04/02/20 0449  WBC 10.0 18.2* 7.5  HGB 12.4* 11.3* 10.7*  HCT 39.2 36.3* 33.8*   BMET Recent Labs    04/01/20 0450 04/02/20 0449  NA 133* 131*  K 4.5 4.5  CL 102 103  CO2 24 22  GLUCOSE 98 104*  BUN 9 5*  CREATININE 0.95 0.80  CALCIUM 8.4* 8.3*   No results for input(s): LABURIN in the last 72 hours. Results for orders placed or performed during the hospital encounter of 03/27/20  SARS Coronavirus 2 by RT PCR (hospital order, performed in [facility] hospital lab) Nasopharyngeal Nasopharyngeal Swab     Status: None   Collection Time: 03/27/20  2:05 AM   Specimen: Nasopharyngeal Swab  Result Value Ref Range Status   SARS Coronavirus 2 NEGATIVE NEGATIVE Final    Comment: (NOTE) SARS-CoV-2 target nucleic acids are NOT DETECTED. The SARS-CoV-2 RNA is generally detectable in upper and lower respiratory specimens  during the acute phase of infection. The lowest concentration of SARS-CoV-2 viral copies this assay can detect is 250 copies / mL. A negative result does not preclude SARS-CoV-2 infection and should not be used as the sole basis for treatment or other patient management decisions.  A negative result may occur with improper specimen collection / handling, submission of specimen other than nasopharyngeal swab, presence of viral mutation(s) within the areas targeted by this assay, and inadequate number of viral copies (<250 copies / mL). A negative result must be combined with clinical observations, patient history, and epidemiological information. Fact Sheet for Patients:   BoilerBrush.com.cy Fact Sheet for Healthcare Providers: https://pope.com/ This test is not yet approved or cleared  by the [place] FDA and has been authorized for detection and/or diagnosis of SARS-CoV-2 by FDA under an Emergency Use Authorization (EUA).  This EUA will remain in effect (meaning this test can be used) for the duration of the COVID-19 declaration under Section 564(b)(1) of the Act, 21 U.S.C. section 360bbb-3(b)(1), unless the authorization is terminated or revoked sooner. Performed at [facility], [address], [place], [place] [address]     Imaging: No results found.
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
## 2020-04-02 NOTE — Plan of Care (Signed)
Plan of care 

## 2020-04-02 NOTE — Progress Notes (Signed)
The patient is alert and oriented and has been seen by his physician. The orders for discharge were written. IV has been removed. Went over discharge instructions with patient. Nurse tech is walking patient down to his ride for discharge with all of his belongings.  

## 2022-01-02 IMAGING — DX DG ABD PORTABLE 1V
1 series · 1 of 1 positions shown · non-contrast
Comparison: Radiograph 03/30/2020

CLINICAL DATA: Follow-up small bowel obstruction, improving
symptoms

EXAM:
PORTABLE ABDOMEN - 1 VIEW

[abdomen kub]
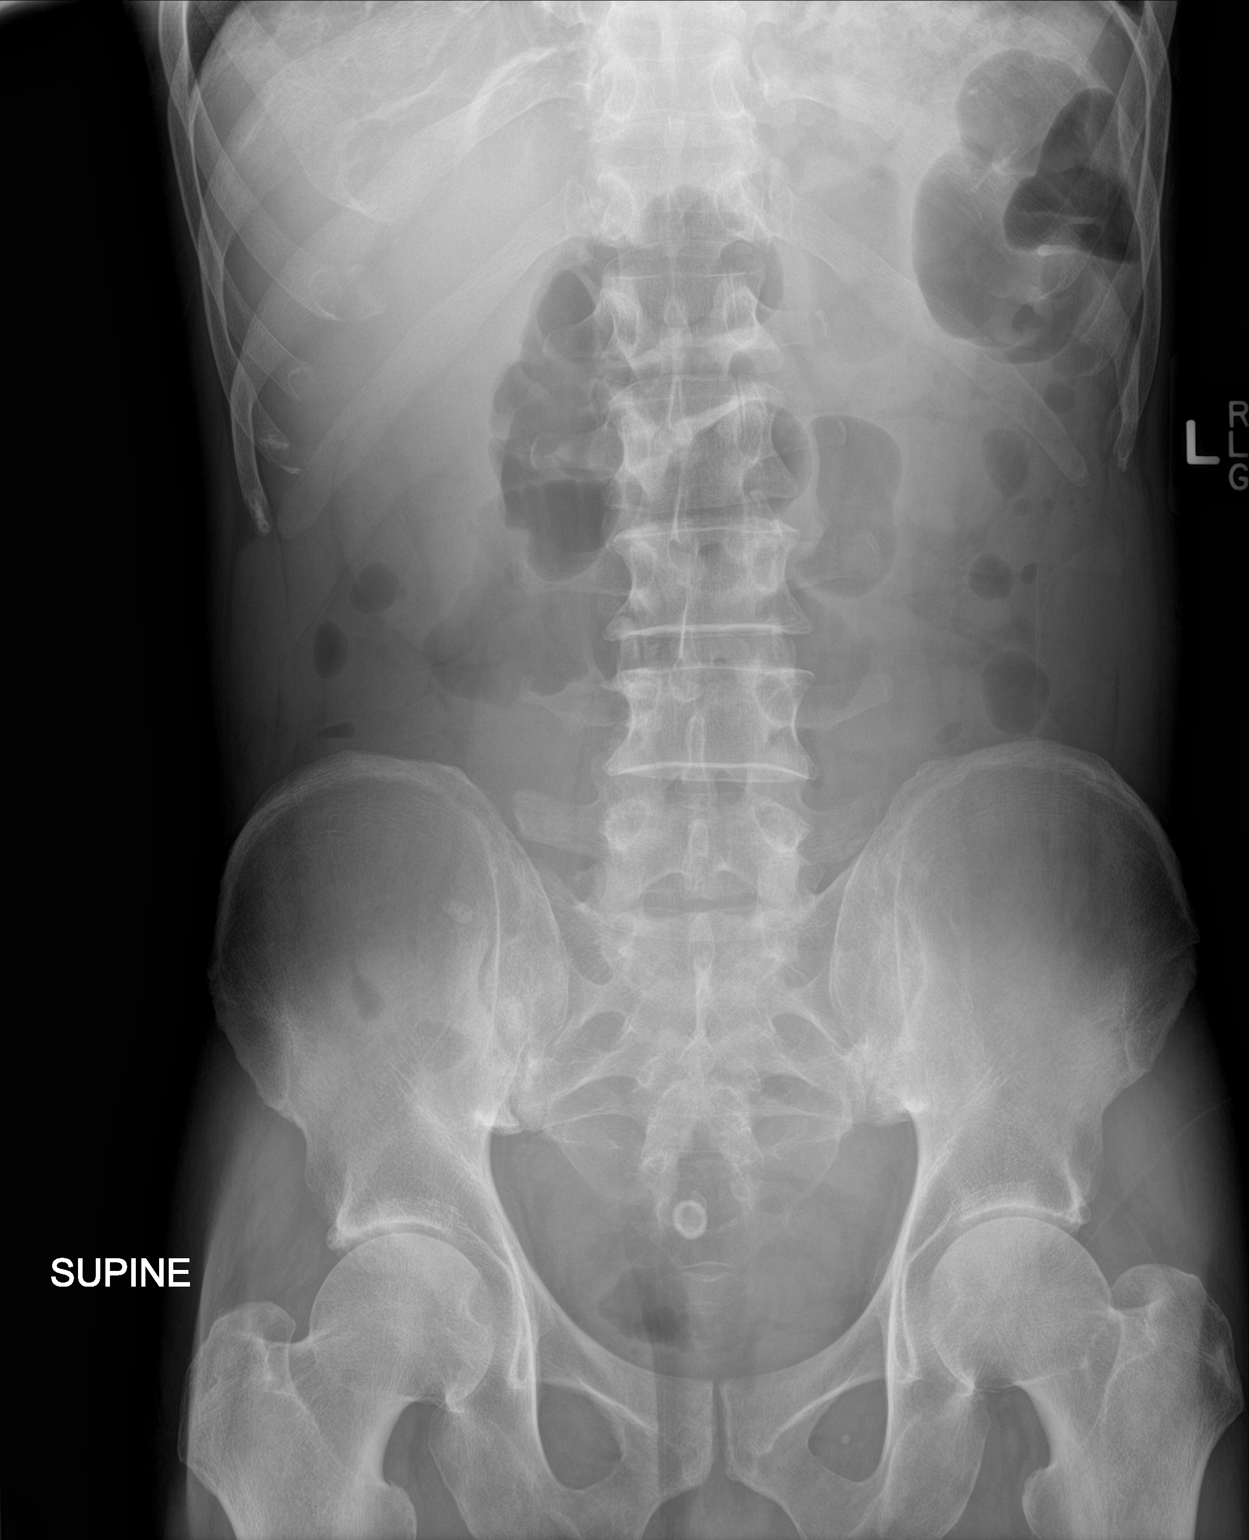

[1 of 1 positions shown; findings below may reference images not displayed]

FINDINGS: There has been interval passage of the high attenuation contrast
previously outlining the colon. Several loops of bowel in the upper
midline abdomen remain mildly distended though overall degree of
which is improved from most recent comparison exam. Few rounded
eggshell calcifications in the pelvis likely reflect peritoneal
loose body seen on comparison studies with some interval
displacement from the comparison. No acute osseous abnormality.
Remaining soft tissues are unremarkable.
IMPRESSION: 1. Passage of the high attenuation contrast material through the
colon. Only minimal residual distention of several mid abdominal
bowel loops likely reflecting improving obstruction.
